# Patient Record
Sex: Female | Born: 1995 | Race: Black or African American | Hispanic: No | Marital: Single | Smoking: Never smoker
Health system: Southern US, Community
[De-identification: ages and names within clinical notes are randomized; demographics above are authoritative.]

## PROBLEM LIST (undated history)

## (undated) DIAGNOSIS — D508 Other iron deficiency anemias: Secondary | ICD-10-CM

## (undated) HISTORY — PX: OTHER SURGICAL HISTORY: SHX169

---

## 2016-08-27 ENCOUNTER — Emergency Department
Admission: EM | Admit: 2016-08-27 | Discharge: 2016-08-27 | Disposition: A | Discharge status: Home / Self Care | Payer: Self-pay | Attending: Emergency Medicine | Admitting: Emergency Medicine

## 2016-08-27 ENCOUNTER — Emergency Department: Payer: Self-pay

## 2016-08-27 ENCOUNTER — Encounter: Payer: Self-pay | Admitting: Emergency Medicine

## 2016-08-27 DIAGNOSIS — R55 Syncope and collapse: Secondary | ICD-10-CM | POA: Insufficient documentation

## 2016-08-27 HISTORY — DX: Other iron deficiency anemias: D50.8

## 2016-08-27 LAB — URINALYSIS, COMPLETE (UACMP) WITH MICROSCOPIC
BILIRUBIN URINE: NEGATIVE
GLUCOSE, UA: NEGATIVE mg/dL
HGB URINE DIPSTICK: NEGATIVE
Ketones, ur: NEGATIVE mg/dL
LEUKOCYTES UA: NEGATIVE
NITRITE: NEGATIVE
Protein, ur: NEGATIVE mg/dL
SPECIFIC GRAVITY, URINE: 1.026 (ref 1.005–1.030)
pH: 6 (ref 5.0–8.0)

## 2016-08-27 LAB — BASIC METABOLIC PANEL
ANION GAP: 10 (ref 5–15)
BUN: 11 mg/dL (ref 6–20)
CALCIUM: 8.8 mg/dL — AB (ref 8.9–10.3)
CO2: 23 mmol/L (ref 22–32)
Chloride: 109 mmol/L (ref 101–111)
Creatinine, Ser: 0.88 mg/dL (ref 0.44–1.00)
GFR calc Af Amer: >60 mL/min (ref >60)
GFR calc non Af Amer: >60 mL/min (ref >60)
Glucose, Bld: 102 mg/dL — ABNORMAL HIGH (ref 65–99)
POTASSIUM: 3.5 mmol/L (ref 3.5–5.1)
SODIUM: 142 mmol/L (ref 135–145)

## 2016-08-27 LAB — CBC
HEMATOCRIT: 43.6 % (ref 35.0–47.0)
HEMOGLOBIN: 14.4 g/dL (ref 12.0–16.0)
MCH: 26.5 pg (ref 26.0–34.0)
MCHC: 33.1 g/dL (ref 32.0–36.0)
MCV: 79.9 fL — ABNORMAL LOW (ref 80.0–100.0)
Platelets: 202 10*3/uL (ref 150–440)
RBC: 5.45 MIL/uL — ABNORMAL HIGH (ref 3.80–5.20)
RDW: 15.1 % — AB (ref 11.5–14.5)
WBC: 9.9 10*3/uL (ref 3.6–11.0)

## 2016-08-27 LAB — POCT PREGNANCY, URINE: Preg Test, Ur: NEGATIVE

## 2016-08-27 LAB — GLUCOSE, CAPILLARY: GLUCOSE-CAPILLARY: 89 mg/dL (ref 65–99)

## 2016-08-27 MED ORDER — SODIUM CHLORIDE 0.9 % IV BOLUS (SEPSIS)
1000.0000 mL | Freq: Once | INTRAVENOUS | Status: AC
  Administered 2016-08-27: 1000 mL via INTRAVENOUS

## 2016-08-27 NOTE — ED Provider Notes (Signed)
[facility] Emergency Department Provider Note   ____________________________________________   First MD Initiated Contact with Patient 08/27/16 1542     (approximate)  I have reviewed the triage vital signs and the nursing notes.   HISTORY  Chief Complaint Loss of Consciousness    HPI Meredith Reid is a 21 y.o. female who donated plasma for the first time. When she finished she was told to go get something to eat so she walked over to the restaurant and got some stuff to eat and then began to get hot and sweaty and sees spots in front of her eyes and she passed out. EMS came to get her she was feeling hot and sweaty again feeling like she would pass out. Her blood pressure was in the 50 systolic. She's never done this before she's never felt like this before she's never given plasma before either. She has no medical problems except for bronchitis and has no complaints of the present time she feels well.   Past Medical History:  Diagnosis Date  . Anemia, due to inadequate iron intake     There are no active problems to display for this patient.   Past Surgical History:  Procedure Laterality Date  . leg braces  Bilateral     Prior to Admission medications   Not on File    Allergies Patient has no known allergies.  History reviewed. No pertinent family history.  Social History Social History  Substance Use Topics  . Smoking status: Never Smoker  . Smokeless tobacco: Never Used  . Alcohol use No    Review of Systems  Constitutional: No fever/chills Eyes: No visual changes. ENT: No sore throat. Cardiovascular: Denies chest pain. Respiratory: Denies shortness of breath. Gastrointestinal: No abdominal pain.  No nausea, no vomiting.  No diarrhea.  No constipation. Genitourinary: Negative for dysuria. Musculoskeletal: Negative for back pain. Skin: Negative for rash. Neurological: Negative for headaches, focal weakness or  numbness.   ____________________________________________   PHYSICAL EXAM:  VITAL SIGNS: ED Triage Vitals  Enc Vitals Group     BP 08/27/16 1525 (!) 102/59     Pulse Rate 08/27/16 1525 70     Resp 08/27/16 1525 16     Temp 08/27/16 1525 98.5 F (36.9 C)     Temp Source 08/27/16 1525 Oral     SpO2 08/27/16 1525 98 %     Weight 08/27/16 1528 229 lb (103.9 kg)     Height 08/27/16 1528 4\' 1"  (1.245 m)     Head Circumference --      Peak Flow --      Pain Score --      Pain Loc --      Pain Edu? --      Excl. in GC? --     Constitutional: Alert and oriented. Well appearing and in no acute distress. Eyes: Conjunctivae are normal.  Head: Atraumatic. Nose: No congestion/rhinnorhea. Mouth/Throat: Mucous membranes are moist.  Oropharynx non-erythematous. Neck: No stridor.   Cardiovascular: Normal rate, regular rhythm. Grossly normal heart sounds.  Good peripheral circulation. Respiratory: Normal respiratory effort.  No retractions. Lungs CTAB. Gastrointestinal: Soft and nontender. No distention. No abdominal bruits. No CVA tenderness. Musculoskeletal: No lower extremity tenderness nor edema.  No joint effusions. Neurologic:  Normal speech and language. No gross focal neurologic deficits are appreciated. Skin:  Skin is warm, dry and intact. No rash noted. Psychiatric: Mood and affect are normal. Speech and behavior are normal.  ____________________________________________  LABS (all labs ordered are listed, but only abnormal results are displayed)  Labs Reviewed  BASIC METABOLIC PANEL - Abnormal; Notable for the following:       Result Value   Glucose, Bld 102 (*)    Calcium 8.8 (*)    All other components within normal limits  CBC - Abnormal; Notable for the following:    RBC 5.45 (*)    MCV 79.9 (*)    RDW 15.1 (*)    All other components within normal limits  GLUCOSE, CAPILLARY  URINALYSIS, COMPLETE (UACMP) WITH MICROSCOPIC  CBG MONITORING, ED  POC URINE PREG, ED    ____________________________________________  EKG  EKG read and interpreted by me shows normal sinus rhythm rate of 75 normal axis no acute ST-T wave changes ____________________________________________  RADIOLOGY  Chest x-ray read as no acute disease ____________________________________________   PROCEDURES  Procedure(s) performed:   Procedures  Critical Care performed:  ____________________________________________   INITIAL IMPRESSION / ASSESSMENT AND PLAN / ED COURSE  Pertinent labs & imaging results that were available during my care of the patient were reviewed by me and considered in my medical decision making (see chart for details).  Patient doing much better after fluids if everything looks well we'll plan on discharging her      ____________________________________________   FINAL CLINICAL IMPRESSION(S) / ED DIAGNOSES  Final diagnoses:  Syncope and collapse      NEW MEDICATIONS STARTED DURING THIS VISIT:  New Prescriptions   No medications on file     Note:  This document was prepared using Dragon voice recognition software and may include unintentional dictation errors.    Arnaldo NatalMalinda, Dutch Ing F, MD 08/27/16 [id number]

## 2016-08-27 NOTE — Discharge Instructions (Signed)
Please rest and take it easy the rest the day today. Drink plenty of fluids. He donated plasma again make sure he drink fluids pretty much right away after you get done. Watch out for the heat for the next day or 2 as well.

## 2016-08-27 NOTE — ED Triage Notes (Signed)
Patient presents to Emergency Department via [facility] with complaints of syncope and vomiting s/p donating plasma for the first time today.  Pt initially refused transport then became disoriented and unable to answer question from EMS.    Pt reports history of anemia, initial CBG of 138, and pt received of NS for initial blood pressure 56/32 HR 73.  BP for EMS at arrival 106/62 in trendelenburg.

## 2018-02-07 IMAGING — DX DG CHEST 1V PORT
1 series · 1 of 1 positions shown · non-contrast
Comparison: None.

CLINICAL DATA: Syncope

EXAM:
PORTABLE CHEST 1 VIEW

[chest ap]
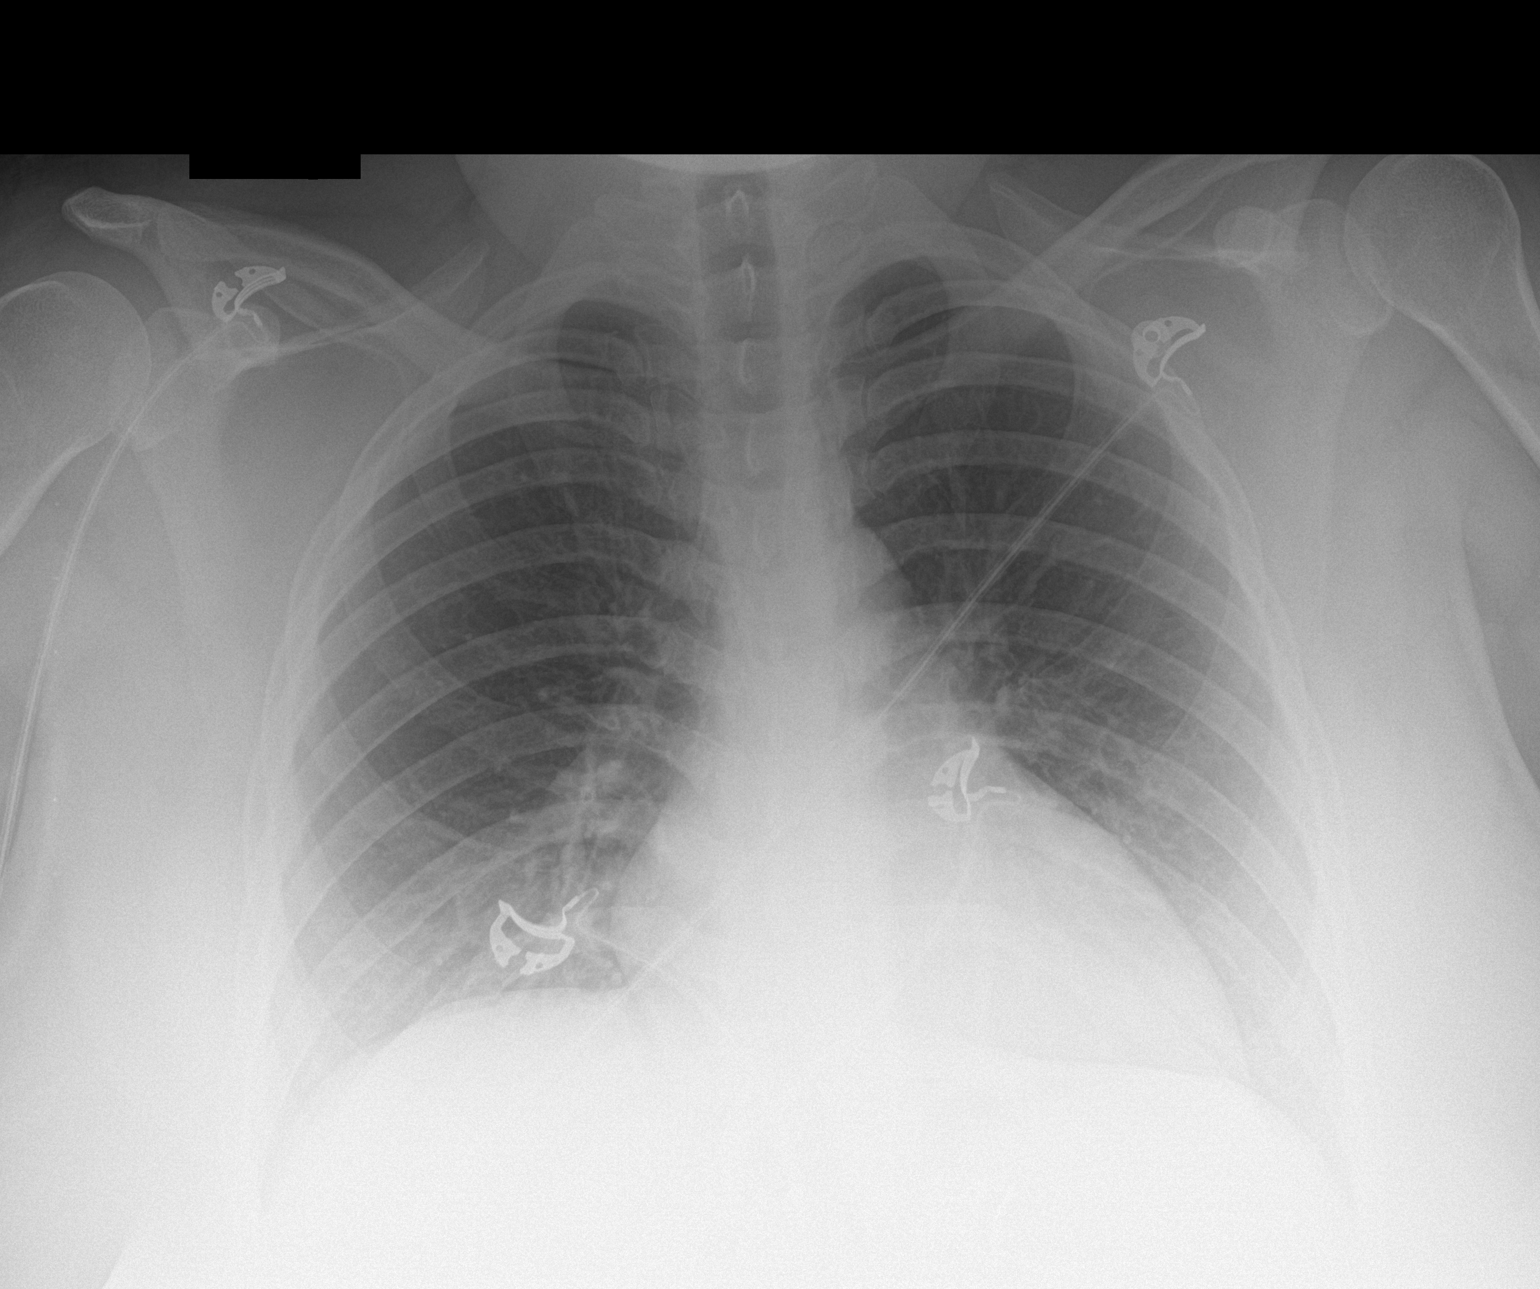

[1 of 1 positions shown; findings below may reference images not displayed]

FINDINGS: The heart size and mediastinal contours are within normal limits.
Both lungs are clear. The visualized skeletal structures are
unremarkable.
IMPRESSION: No active disease.
